# Patient Record
Sex: Male | Born: 1995
Health system: Southern US, Community
[De-identification: ages and names within clinical notes are randomized; demographics above are authoritative.]

## PROBLEM LIST (undated history)

## (undated) DIAGNOSIS — J45909 Unspecified asthma, uncomplicated: Secondary | ICD-10-CM

## (undated) HISTORY — PX: SHOULDER SURGERY: SHX246

---
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
## 2015-03-29 IMAGING — CR DG ABDOMEN ACUTE W/ 1V CHEST
3 series · 3 of 3 positions shown · non-contrast
Comparison: None.

CLINICAL DATA: Pain with nausea and vomiting

EXAM:
ACUTE ABDOMEN SERIES (ABDOMEN 2 VIEW & CHEST 1 VIEW)

[view not recorded (1 of 3)]
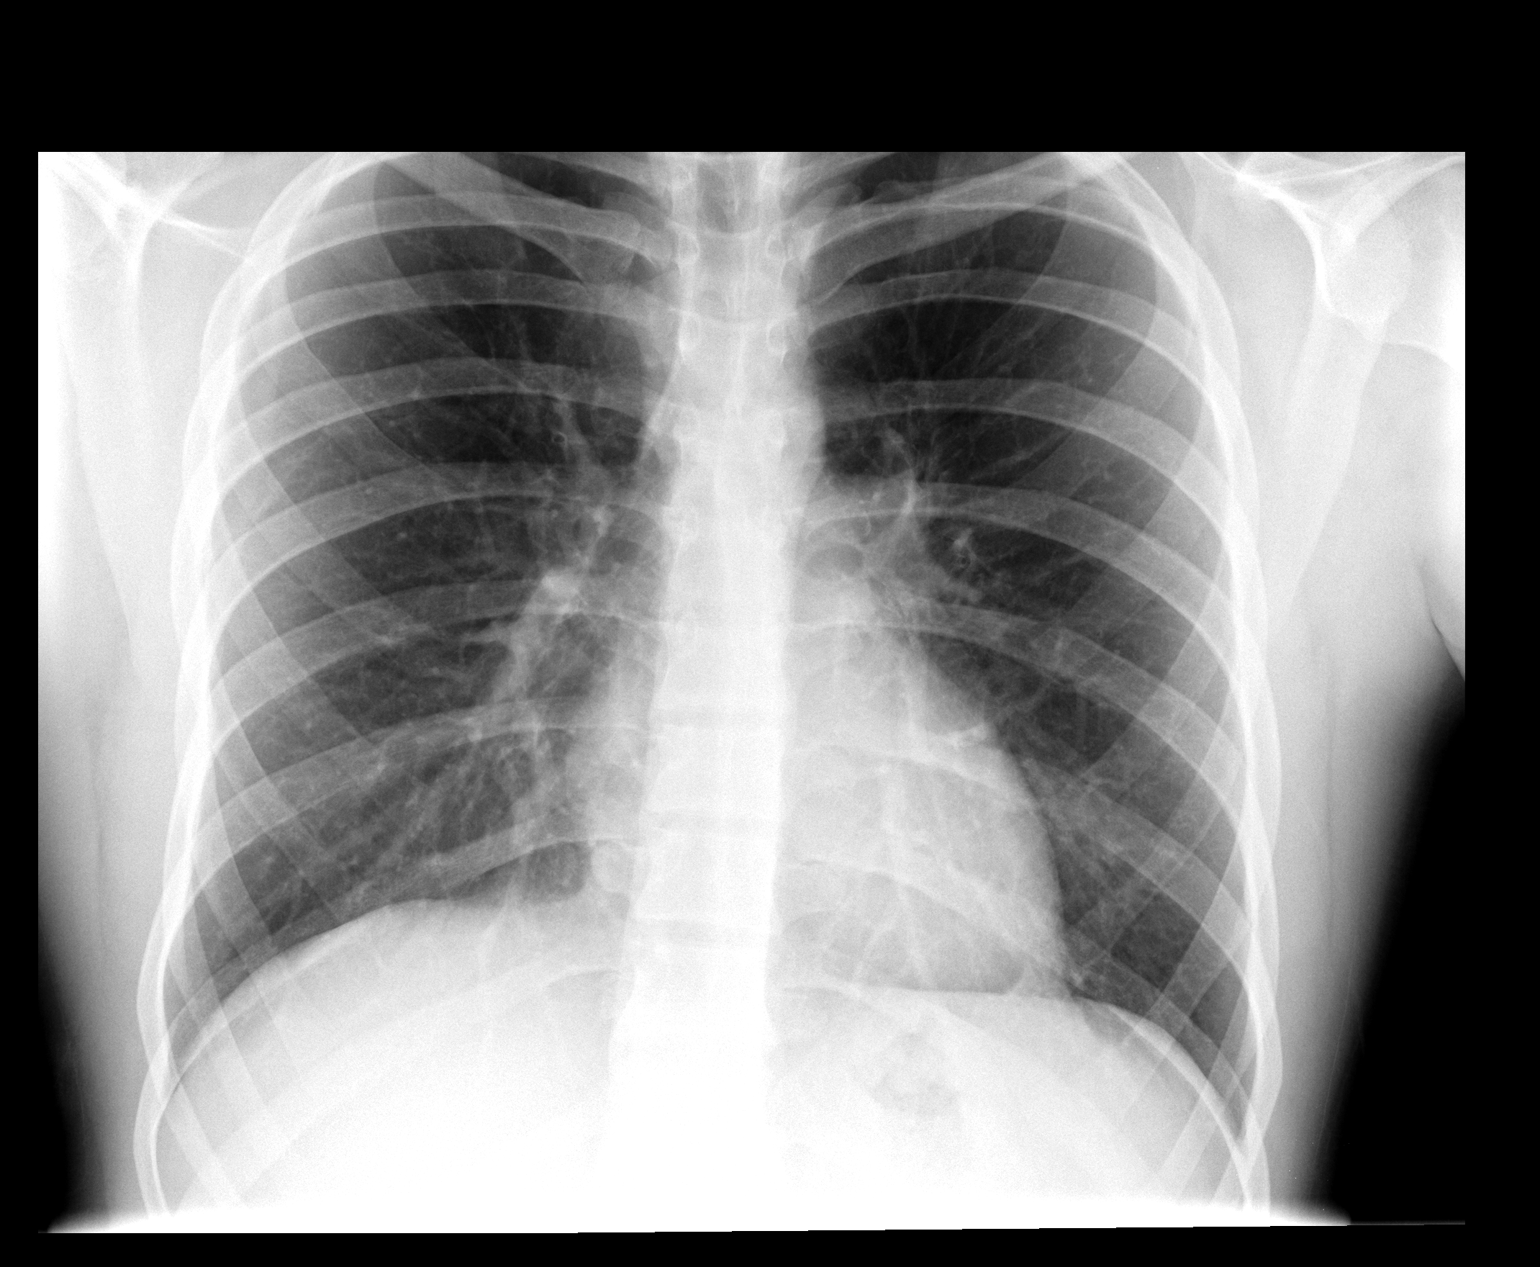

[view not recorded (2 of 3)]
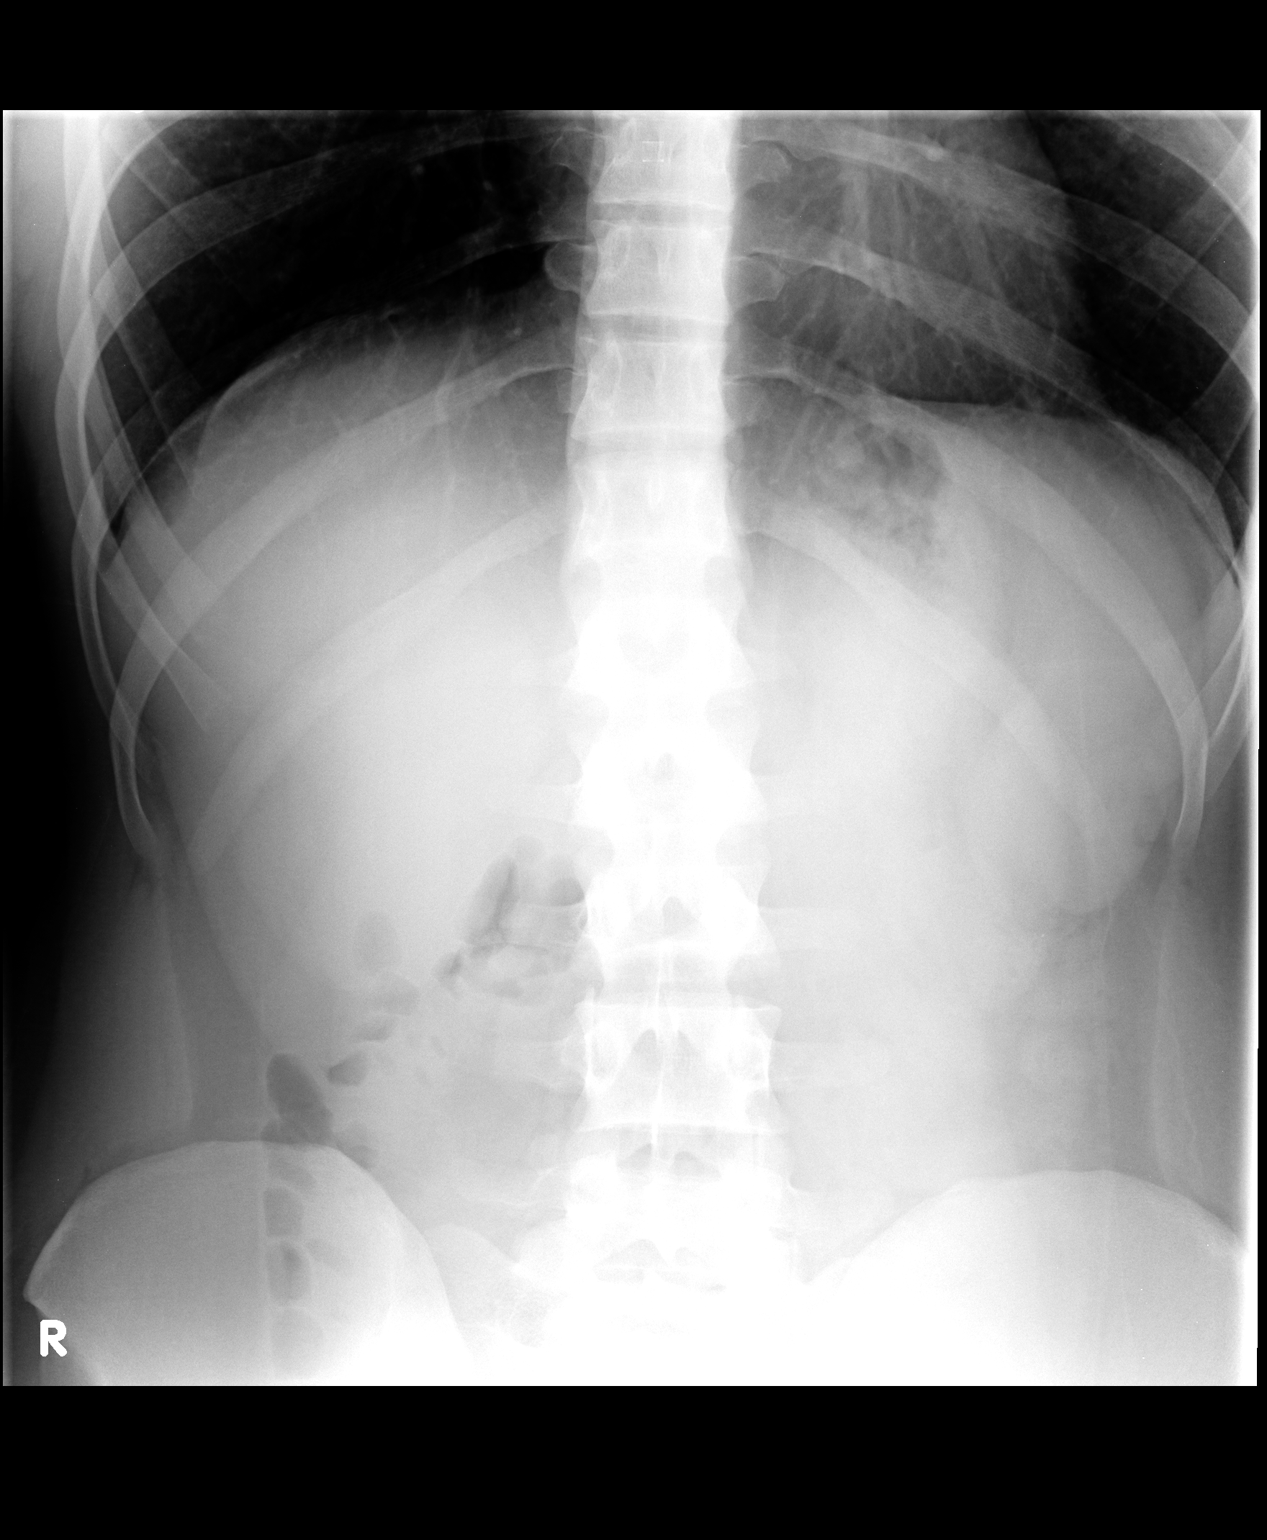

[view not recorded (3 of 3)]
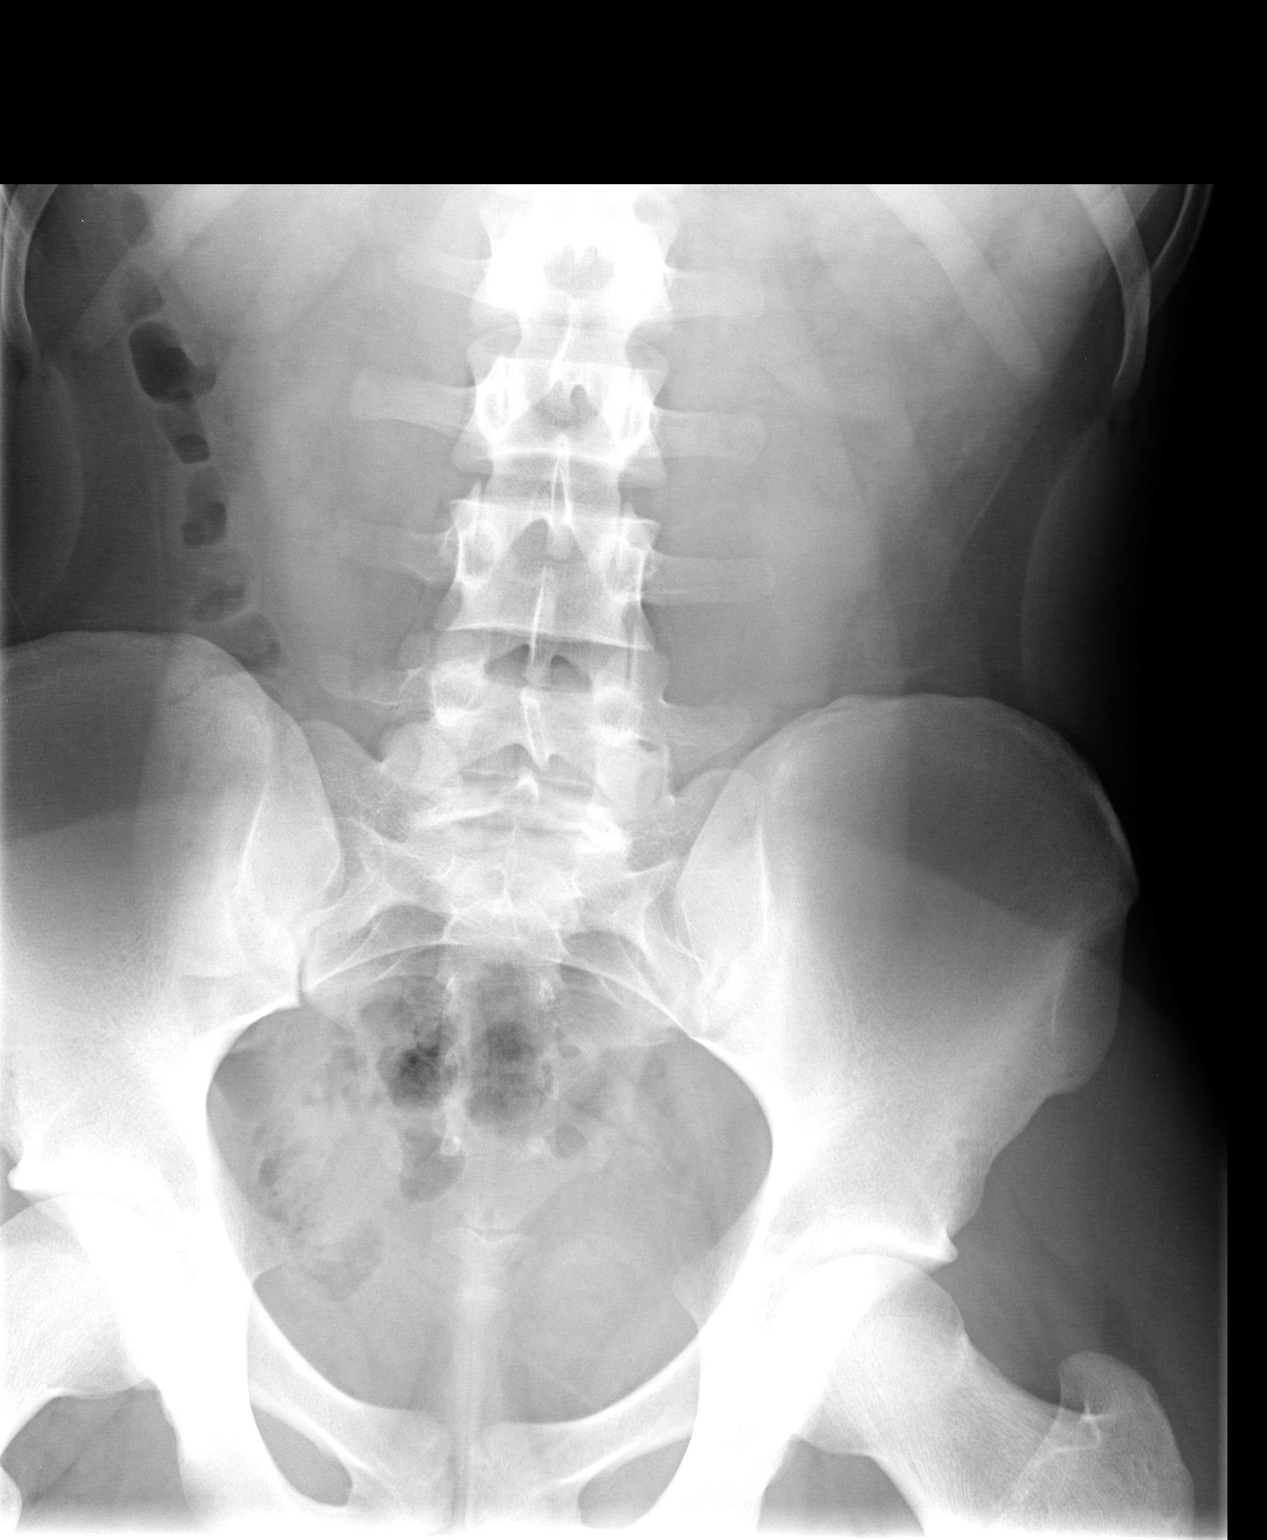

[3 of 3 positions shown; findings below may reference images not displayed]

FINDINGS: PA chest: Lungs are clear. Heart size and pulmonary vascularity are
normal. No adenopathy.

Supine and upright abdomen: The bowel gas pattern is unremarkable.
No obstruction or free air. No abnormal calcifications.
IMPRESSION: Bowel gas pattern unremarkable.  Lungs clear.

## 2016-08-25 ENCOUNTER — Ambulatory Visit: Payer: 59 | Attending: Physician Assistant | Admitting: Physical Therapy

## 2016-08-25 DIAGNOSIS — M546 Pain in thoracic spine: Secondary | ICD-10-CM | POA: Diagnosis not present

## 2016-08-25 DIAGNOSIS — M6281 Muscle weakness (generalized): Secondary | ICD-10-CM

## 2016-08-25 NOTE — Therapy (Signed)
[facility] [place] [address]  [address] [place], [place], [address] Phone: [phone]   Fax:  [phone]  Physical Therapy Evaluation  Patient Details  Name: Sean Gibson MRN: [id number] Date of Birth: Apr 23, 1996 Referring Provider: Anette Riedelarmen Mayo, PA  Encounter Date: 08/25/2016      PT End of Session - 08/25/16 0837    Visit Number 1   Number of Visits 6  may see more visits if needed; up to 2x/wk   Date for PT Re-Evaluation 10/06/16   PT Start Time 0800   PT Stop Time 0835   PT Time Calculation (min) 35 min   Activity Tolerance Patient tolerated treatment well   Behavior [facility] for tasks assessed/performed      Past Medical History:  Diagnosis Date  . Asthma     Past Surgical History:  Procedure Laterality Date  . SHOULDER SURGERY      There were no vitals filed for this visit.       Subjective Assessment - 08/25/16 0801    Subjective Pt is a 20 y/o male who presents to OPPT with 2 wk hx of L scapular pain and soreness.  Pt reports increased soreness with repetitive use, denies weakness but reports pain.  Soreness is after exercise; better today with a few days of rest.   Limitations Lifting   Patient Stated Goals improve pain   Currently in Pain? Yes   Pain Score --  "moderate discomfort" but no pain   Pain Location Scapula   Pain Orientation Mid;Left   Pain Descriptors / Indicators Discomfort;Sore   Pain Type Acute pain   Pain Onset 1 to 4 weeks ago   Pain Frequency Constant   Aggravating Factors  repetitive motions, driving with forward flexion; anything that requires use of shoulder   Pain Relieving Factors rest, ice, ibuprofen            [facility] PT Assessment - 08/25/16 0805      Assessment   Medical Diagnosis L scapula pain, thoracic pain   Referring Provider Anette Riedelarmen Mayo, PA   Onset Date/Surgical Date --  2 wks ago   Hand Dominance Right   Next MD Visit 09/18/16   Prior  Therapy after R shoulder surgery to repair R ligament     Precautions   Precautions None     Restrictions   Weight Bearing Restrictions No     Balance Screen   Has the patient fallen in the past 6 months No   Has the patient had a decrease in activity level because of a fear of falling?  No   Is the patient reluctant to leave their home because of a fear of falling?  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)   Leisure gym 5 days/wk     Cognition   Overall Cognitive Status Within Functional Limits for tasks assessed     Observation/Other Assessments   Focus on Therapeutic Outcomes (FOTO)  53 (47% limited; predicted 26% limited)     Posture/Postural Control   Posture/Postural Control Postural limitations   Postural Limitations Rounded Shoulders;Forward head     AROM   Overall AROM Comments bil shoulders WNL without pain     Strength   Strength Assessment Site Shoulder   Right Shoulder Flexion 5/5  Right Shoulder ABduction 5/5   Right Shoulder Internal Rotation 5/5   Right Shoulder External Rotation 5/5   Left Shoulder Flexion 5/5   Left Shoulder ABduction 4/5   Left Shoulder Internal Rotation 5/5   Left Shoulder External Rotation 5/5  painful with resistance     Palpation   Palpation comment tenderness along infraspinatus, rhomboids, levator scapula and supraspinatus on L     Special Tests    Special Tests Rotator Cuff Impingement;Cervical   Cervical Tests Spurling's;Dictraction   Rotator Cuff Impingment tests Leanord Asal test     Spurling's   Findings Negative     Distraction Test   Findngs Negative     Hawkins-Kennedy test   Findings Negative   Side Left                   [facility] Adult PT Treatment/Exercise - 08/25/16 0805      Exercises   Exercises Shoulder     Shoulder  Exercises: Stretch   Cross Chest Stretch 1 rep;30 seconds   Cross Chest Stretch Limitations Left with use of doorway   Other Shoulder Stretches use of ball for self mobilization and myofacial release   Other Shoulder Stretches seated mid thoracic stretch with physioball 2x30 sec                PT Education - 08/25/16 0837    Education provided Yes   Education Details HEP   Person(s) Educated Patient   Methods Explanation;Demonstration;Handout   Comprehension Verbalized understanding;Returned demonstration;Need further instruction             PT Long Term Goals - 08/25/16 1610      PT LONG TERM GOAL #1   Title independent with HEP (10/06/16)   Time 6   Period Weeks   Status New     PT LONG TERM GOAL #2   Title tolerate resistance with ER muscle testing without pain (10/06/16)   Time 6   Period Weeks   Status New     PT LONG TERM GOAL #3   Title improve L shoulder abduction strength to 5/5 for improved strength (10/06/16)   Time 6   Period Weeks   Status New     PT LONG TERM GOAL #4   Title return to work full duty without increase in pain  for improved function (10/06/16)   Time 6   Period Weeks   Status New               Plan - 08/25/16 9604    Clinical Impression Statement Pt is a 20 y/o male who presents to OPPT for low complexity PT eval for L shoulder and thoracic pain.  Pt demonstrates pain, postural abnormalities and mild weakness affecting functional moblity.  Will benefit from PT to address deficits.   Rehab Potential Good   PT Frequency 1x / week  may see 2x/wk if needed   PT Duration 6 weeks   PT Treatment/Interventions ADLs/Self Care Home Management;Cryotherapy;Electrical Stimulation;Iontophoresis 4mg /ml Dexamethasone;Moist Heat;Ultrasound;Neuromuscular re-education;Therapeutic exercise;Therapeutic activities;Patient/family education;Manual techniques;Vasopneumatic Device;Dry needling   PT Next Visit Plan review HEP; scap strengthening,  er/abdct strengthening   Consulted and Agree with Plan of Care Patient      Patient will benefit from skilled therapeutic intervention in order to improve the following deficits and impairments:  Pain, Impaired UE functional use, Postural dysfunction, Decreased strength  Visit Diagnosis: Pain in thoracic spine - Plan: PT plan of care cert/re-cert  Muscle weakness (generalized) -  Plan: PT plan of care cert/re-cert     Problem List There are no active problems to display for this patient.      Clarita Crane, PT, DPT 08/25/16 8:43 AM    [facility] [address]  [address] [place], [place], [address] Phone: [phone]   Fax:  [phone]  Name: Sean Gibson MRN: [id number] Date of Birth: 1996/03/02

## 2016-08-25 NOTE — Patient Instructions (Signed)
   Posterior Capsule-Adduction Stretch (Active)    Stand in doorway with left hand holding door frame. Lean to left holding onto doorway. Hold _20-30__ seconds.  Repeat _2-3__ times per session. Do __1-2_ sessions per day.  Copyright  VHI. All rights reserved.    Use a tennis ball up against the wall for self massage along the back of your shoulder.  Side Waist Stretch from Child's Pose    From child's pose, walk hands to left. Reach right hand out on diagonal. Reach hips back toward heels making a C with torso. Breathe into right side waist. Hold for __20-30__ seconds. Repeat _2-3__ times each side.  Perform 2-3 sessions a day.  Copyright  VHI. All rights reserved.

## 2016-08-31 ENCOUNTER — Ambulatory Visit: Payer: 59 | Admitting: Physical Therapy

## 2016-08-31 DIAGNOSIS — M546 Pain in thoracic spine: Secondary | ICD-10-CM | POA: Diagnosis not present

## 2016-08-31 DIAGNOSIS — M6281 Muscle weakness (generalized): Secondary | ICD-10-CM

## 2016-08-31 NOTE — Therapy (Signed)
[facility] [address]  [address] [place], [place], [address] Phone: [phone]   Fax:  [phone]  Physical Therapy Treatment  Patient Details  Name: Sean Gibson MRN: [id number] Date of Birth: [birth year] Referring Provider: Anette Riedel, PA  Encounter Date: 08/31/2016      PT End of Session - 08/31/16 1044    Visit Number 2   Number of Visits 6   Date for PT Re-Evaluation 10/06/16   PT Start Time 0802   PT Stop Time 0858   PT Time Calculation (min) 56 min   Activity Tolerance Patient tolerated treatment well   Behavior During Therapy [facility] for tasks assessed/performed      Past Medical History:  Diagnosis Date  . Asthma     Past Surgical History:  Procedure Laterality Date  . SHOULDER SURGERY      There were no vitals filed for this visit.      Subjective Assessment - 08/31/16 0804    Subjective feeling a little better; not as painful when just sitting.  had a little pain when trying to jog at the gym the other day   Limitations Lifting   Patient Stated Goals improve pain   Currently in Pain? No/denies                         [facility] Adult PT Treatment/Exercise - 08/31/16 0805      Shoulder Exercises: Supine   Horizontal ABduction Both;15 reps;Theraband   Theraband Level (Shoulder Horizontal ABduction) Level 3 (Green)   External Rotation Both;15 reps;Theraband   Theraband Level (Shoulder External Rotation) Level 3 (Green)     Shoulder Exercises: ROM/Strengthening   UBE (Upper Arm Bike) L 4.5 x 6 min (3' fwd / 3' bwd)     Shoulder Exercises: Stretch   Cross Chest Stretch 3 reps;30 seconds   Cross Chest Stretch Limitations Left with use of doorway   Other Shoulder Stretches seated mid thoracic stretch with physioball 2x30 sec     Modalities   Modalities Electrical Stimulation;Moist Heat;Ultrasound     Moist Heat Therapy   Number Minutes Moist Heat 15 Minutes   Moist Heat  Location Other (comment)  thoracic spine     Electrical Stimulation   Electrical Stimulation Location L mid thoracic paraspinals   Electrical Stimulation Action pre mod   Electrical Stimulation Parameters to tolerance   Electrical Stimulation Goals Pain;Tone     Ultrasound   Ultrasound Location L paraspinals   Ultrasound Parameters 100% DC, 1.0 w/cm2, x 8 min   Ultrasound Goals Pain     Manual Therapy   Manual Therapy Soft tissue mobilization   Soft tissue mobilization L mid thoracic paraspinals and rhomboids                     PT Long Term Goals - 08/25/16 6962      PT LONG TERM GOAL #1   Title independent with HEP (10/06/16)   Time 6   Period Weeks   Status New     PT LONG TERM GOAL #2   Title tolerate resistance with ER muscle testing without pain (10/06/16)   Time 6   Period Weeks   Status New     PT LONG TERM GOAL #3   Title improve L shoulder abduction strength to 5/5 for improved strength (10/06/16)   Time 6   Period Weeks   Status New  PT LONG TERM GOAL #4   Title return to work full duty without increase in pain  for improved function (10/06/16)   Time 6   Period Weeks   Status New               Plan - 08/31/16 1045    Clinical Impression Statement Pt reports slight improvement in symptoms but continues to have pain with increased activity.  Pt with tightness and trigger points along L rhomboids, and thoracic paraspinals.  Will continue to benefit from PT to maximize function.   PT Treatment/Interventions ADLs/Self Care Home Management;Cryotherapy;Electrical Stimulation;Iontophoresis 4mg /ml Dexamethasone;Moist Heat;Ultrasound;Neuromuscular re-education;Therapeutic exercise;Therapeutic activities;Patient/family education;Manual techniques;Vasopneumatic Device;Dry needling   PT Next Visit Plan scap strengthening, er/abdct strengthening, continue modalities PRN   Consulted and Agree with Plan of Care Patient      Patient will  benefit from skilled therapeutic intervention in order to improve the following deficits and impairments:  Pain, Impaired UE functional use, Postural dysfunction, Decreased strength  Visit Diagnosis: Pain in thoracic spine  Muscle weakness (generalized)     Problem List There are no active problems to display for this patient.      Clarita CraneStephanie F Hudson Lehmkuhl, PT, DPT 08/31/16 10:47 AM    [facility] [place] [address]  [address] [place], [place], [address] Phone: [phone]   Fax:  [phone]  Name: Sean Gibson MRN: [id number] Date of Birth: 1996/05/16

## 2016-09-08 ENCOUNTER — Ambulatory Visit: Payer: 59 | Attending: Physician Assistant | Admitting: Physical Therapy

## 2016-09-08 DIAGNOSIS — M6281 Muscle weakness (generalized): Secondary | ICD-10-CM

## 2016-09-08 DIAGNOSIS — M546 Pain in thoracic spine: Secondary | ICD-10-CM | POA: Diagnosis not present
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
## 2016-09-15 ENCOUNTER — Ambulatory Visit: Payer: 59 | Admitting: Physical Therapy

## 2016-09-15 DIAGNOSIS — M546 Pain in thoracic spine: Secondary | ICD-10-CM | POA: Diagnosis not present

## 2016-09-15 DIAGNOSIS — M6281 Muscle weakness (generalized): Secondary | ICD-10-CM
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
## 2020-12-18 ENCOUNTER — Emergency Department
Admission: EM | Admit: 2020-12-18 | Discharge: 2020-12-18 | Disposition: A | Discharge status: Home / Self Care | Payer: Managed Care, Other (non HMO) | Source: Home / Self Care

## 2020-12-18 ENCOUNTER — Other Ambulatory Visit: Payer: Self-pay

## 2020-12-18 DIAGNOSIS — S39011A Strain of muscle, fascia and tendon of abdomen, initial encounter: Secondary | ICD-10-CM | POA: Diagnosis not present

## 2020-12-18 HISTORY — DX: Unspecified asthma, uncomplicated: J45.909

## 2020-12-18 LAB — POCT URINALYSIS DIP (MANUAL ENTRY)
Bilirubin, UA: NEGATIVE
Glucose, UA: NEGATIVE mg/dL
Ketones, POC UA: NEGATIVE mg/dL
Nitrite, UA: NEGATIVE
Protein Ur, POC: NEGATIVE mg/dL
Spec Grav, UA: <=1.005 — AB (ref 1.010–1.025)
Urobilinogen, UA: 0.2 E.U./dL
pH, UA: 6 (ref 5.0–8.0)

## 2020-12-18 MED ORDER — NAPROXEN 500 MG PO TABS: 500.0000 mg | ORAL_TABLET | Freq: Two times a day (BID) | ORAL | 0 refills | Status: AC

## 2020-12-18 NOTE — Discharge Instructions (Addendum)
Urine analysis unremarkable. Continue treatment as prescribed with Naproxen 500 mg twice daily and refrain from weight lifting for at least 3 days. Resume normal activity after 3 days.  Recommend stretches and sit-up in moderation to reduce muscle stiffness. If you have any severe stabbing abdominal pain, develop nausea or vomiting or fever, development of these symptoms warrant emergent evaluation in the setting of emergency department.

## 2020-12-18 NOTE — ED Triage Notes (Signed)
Pt presents to Urgent Care with c/o R-sided abdominal pain (upper and lower) x 3 days. Pt reports pain is a "dull ache" and feels like a muscle strain. States he was lifting weights when he first noticed the pain.

## 2020-12-18 NOTE — ED Provider Notes (Signed)
[name]    CSN: [id number] Arrival date & time: 12/18/20  1124      History   Chief Complaint Chief Complaint  Patient presents with  . Abdominal Pain    HPI Sean Gibson is a 25 y.o. male.   HPI  Patient presents with bilateral lower right and left abdominal pain and the pain progressively moves into different areas of the abdomen throughout the day.  He has been going to different sources and causes of the pain which has increased anxiety as to what the symptoms may be related to.  He denies any nausea, vomiting, changes in stool patterns, changes in stool consistently, changes in appetite or fever. Past Medical History:  Diagnosis Date  . Asthma     There are no problems to display for this patient.   Past Surgical History:  Procedure Laterality Date  . SHOULDER SURGERY Right        Home Medications    Prior to Admission medications   Medication Sig Start Date End Date Taking? Authorizing Provider  acetaminophen (TYLENOL) 500 MG tablet Take 500 mg by mouth every 6 (six) hours as needed.   Yes [provider]  budesonide-formoterol (SYMBICORT) 160-4.5 MCG/ACT inhaler 2 puffs 05/31/20  Yes [provider]  albuterol (VENTOLIN HFA) 108 (90 Base) MCG/ACT inhaler 2 puffs every 4 hours as needed    [provider]    Family History Family History  Problem Relation Age of Onset  . Healthy Mother   . Healthy Father     Social History Social History   Tobacco Use  . Smoking status: Never Smoker  . Smokeless tobacco: Never Used  Vaping Use  . Vaping Use: Never used  Substance Use Topics  . Alcohol use: Yes    Alcohol/week: 1.0 standard drink    Types: 1 Standard drinks or equivalent per week    Comment: daily  . Drug use: Not Currently     Allergies   Patient has no known allergies.   Review of Systems Review of Systems Pertinent negatives listed in HPI Physical Exam Triage Vital Signs ED Triage  Vitals  Enc Vitals Group     BP 12/18/20 1303 (!) 158/76     Pulse Rate 12/18/20 1303 (!) 54     Resp 12/18/20 1303 18     Temp 12/18/20 1303 97.6 F (36.4 C)     Temp Source 12/18/20 1303 Oral     SpO2 12/18/20 1303 100 %     Weight 12/18/20 1301 210 lb (95.3 kg)     Height 12/18/20 1301 5\' 10"  (1.778 m)     Head Circumference --      Peak Flow --      Pain Score 12/18/20 1301 2     Pain Loc --      Pain Edu? --      Excl. in GC? --    No data found.  Updated Vital Signs BP (!) 158/76 (BP Location: Right Arm)   Pulse (!) 54   Temp 97.6 F (36.4 C) (Oral)   Resp 18   Ht 5\' 10"  (1.778 m)   Wt 210 lb (95.3 kg)   SpO2 100%   BMI 30.13 kg/m   Visual Acuity Right Eye Distance:   Left Eye Distance:   Bilateral Distance:    Right Eye Near:   Left Eye Near:    Bilateral Near:     Physical Exam Vitals reviewed.  Cardiovascular:  Rate and Rhythm: Normal rate and regular rhythm.  Pulmonary:     Effort: Pulmonary effort is normal.     Breath sounds: Normal breath sounds.  Abdominal:     Palpations: Abdomen is soft.     Tenderness: There is no abdominal tenderness.     Hernia: No hernia is present.     Comments: Exam benign, no palpable tenderness, mass, or bulge present. No CVA tenderness present.  Psychiatric:        Attention and Perception: Attention normal.        Mood and Affect: Mood is anxious. Affect is flat.        Speech: Speech normal.      UC Treatments / Results  Labs (all labs ordered are listed, but only abnormal results are displayed) Labs Reviewed - No data to display  EKG   Radiology No results found.  Procedures Procedures (including critical care time)  Medications Ordered in UC Medications - No data to display  Initial Impression / Assessment and Plan / UC Course  I have reviewed the triage vital signs and the nursing notes.  Pertinent labs & imaging results that were available during my care of the patient were reviewed by  me and considered in my medical decision making (see chart for details).     Patient presents with an atypical pain pattern associated with abdominal pain which has been present x 3 days without associated N&V, fever, changes in bowel habits, and normal appetite. Abdominal exam grossly normal. No palpable tenderness or mass. Patient is very anxious regarding health and has not attempted relief with conservative measures. UA unremarkable for gross hemature or anything concerning for an urinary etiology. Suspect abdominal wall strain. Treatment per discharge discharge medications, with strict ER precautions to follow-up in ER if symptoms worsen or become severe. Final Clinical Impressions(s) / UC Diagnoses   Final diagnoses:  Strain of abdominal wall, initial encounter     Discharge Instructions     Urine analysis unremarkable. Continue treatment as prescribed with Naproxen 500 mg twice daily and refrain from weight lifting for at least 3 days. Resume normal activity after 3 days.  Recommend stretches and sit-up in moderation to reduce muscle stiffness. If you have any severe stabbing abdominal pain, develop nausea or vomiting or fever, development of these symptoms warrant emergent evaluation in the setting of emergency department.    ED Prescriptions    Medication Sig Dispense Auth. Provider   naproxen (NAPROSYN) 500 MG tablet Take 1 tablet (500 mg total) by mouth 2 (two) times daily with a meal. 30 tablet Bing Neighbors, FNP     PDMP not reviewed this encounter.   Bing Neighbors, FNP 12/19/20 [phone]
